# Patient Record
Sex: Female | Born: 1950 | Race: White | Hispanic: No | Marital: Single | Smoking: Current every day smoker
Health system: Southern US, Community
[De-identification: ages and names within clinical notes are randomized; demographics above are authoritative.]

## PROBLEM LIST (undated history)

## (undated) DIAGNOSIS — N200 Calculus of kidney: Secondary | ICD-10-CM

## (undated) DIAGNOSIS — F17200 Nicotine dependence, unspecified, uncomplicated: Secondary | ICD-10-CM

## (undated) DIAGNOSIS — M1610 Unilateral primary osteoarthritis, unspecified hip: Secondary | ICD-10-CM

## (undated) DIAGNOSIS — J309 Allergic rhinitis, unspecified: Secondary | ICD-10-CM

## (undated) DIAGNOSIS — Z860100 Personal history of colon polyps, unspecified: Secondary | ICD-10-CM

## (undated) DIAGNOSIS — M169 Osteoarthritis of hip, unspecified: Secondary | ICD-10-CM

## (undated) DIAGNOSIS — K259 Gastric ulcer, unspecified as acute or chronic, without hemorrhage or perforation: Secondary | ICD-10-CM

## (undated) DIAGNOSIS — F419 Anxiety disorder, unspecified: Secondary | ICD-10-CM

## (undated) DIAGNOSIS — E7800 Pure hypercholesterolemia, unspecified: Secondary | ICD-10-CM

## (undated) DIAGNOSIS — I10 Essential (primary) hypertension: Secondary | ICD-10-CM

## (undated) DIAGNOSIS — D492 Neoplasm of unspecified behavior of bone, soft tissue, and skin: Secondary | ICD-10-CM

## (undated) DIAGNOSIS — M1990 Unspecified osteoarthritis, unspecified site: Secondary | ICD-10-CM

## (undated) DIAGNOSIS — F329 Major depressive disorder, single episode, unspecified: Secondary | ICD-10-CM

## (undated) DIAGNOSIS — F32A Depression, unspecified: Secondary | ICD-10-CM

## (undated) HISTORY — DX: Osteoarthritis of hip, unspecified: M16.9

## (undated) HISTORY — DX: Calculus of kidney: N20.0

## (undated) HISTORY — PX: TUBAL LIGATION: SHX77

## (undated) HISTORY — DX: Gastric ulcer, unspecified as acute or chronic, without hemorrhage or perforation: K25.9

## (undated) HISTORY — DX: Neoplasm of unspecified behavior of bone, soft tissue, and skin: D49.2

## (undated) HISTORY — DX: Anxiety disorder, unspecified: F41.9

## (undated) HISTORY — DX: Personal history of colon polyps, unspecified: Z86.0100

## (undated) HISTORY — DX: Pure hypercholesterolemia, unspecified: E78.00

## (undated) HISTORY — DX: Depression, unspecified: F32.A

## (undated) HISTORY — DX: Unilateral primary osteoarthritis, unspecified hip: M16.10

## (undated) HISTORY — DX: Essential (primary) hypertension: I10

## (undated) HISTORY — DX: Nicotine dependence, unspecified, uncomplicated: F17.200

## (undated) HISTORY — PX: COSMETIC SURGERY: SHX468

## (undated) HISTORY — DX: Allergic rhinitis, unspecified: J30.9

## (undated) HISTORY — DX: Unspecified osteoarthritis, unspecified site: M19.90

## (undated) HISTORY — DX: Personal history of colonic polyps: Z86.010

## (undated) HISTORY — DX: Major depressive disorder, single episode, unspecified: F32.9

---
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
## 1974-07-20 HISTORY — PX: OTHER SURGICAL HISTORY: SHX169

## 1975-07-21 HISTORY — PX: ABDOMINAL HYSTERECTOMY: SHX81

## 2007-10-20 ENCOUNTER — Emergency Department: Payer: Self-pay | Admitting: Emergency Medicine

## 2008-09-17 HISTORY — PX: OTHER SURGICAL HISTORY: SHX169

## 2009-07-20 HISTORY — PX: SQUAMOUS CELL CARCINOMA EXCISION: SHX2433

## 2010-07-28 ENCOUNTER — Ambulatory Visit: Payer: Self-pay | Admitting: Unknown Physician Specialty

## 2010-07-30 LAB — PATHOLOGY REPORT

## 2010-08-20 HISTORY — PX: OTHER SURGICAL HISTORY: SHX169

## 2010-09-08 ENCOUNTER — Emergency Department: Payer: Self-pay | Admitting: Internal Medicine

## 2011-09-28 IMAGING — CT CT STONE STUDY
1 of 2 series · 16 of 32 positions shown, 20 images · non-contrast
Comparison: none

REASON FOR EXAM: flank pain r
COMMENTS:

PROCEDURE:     CT  - CT ABDOMEN /PELVIS WO (STONE)  - September 08, 2010 [DATE]
RESULT:     History: Right flank pain.
Comparison Study: No prior.

[Series 2: soft tissue · axial · 0.78mm/px · z∈[-1084,-640]mm · 16 of 162 slices shown, 20 images]
[im 7/162  soft-tissue]
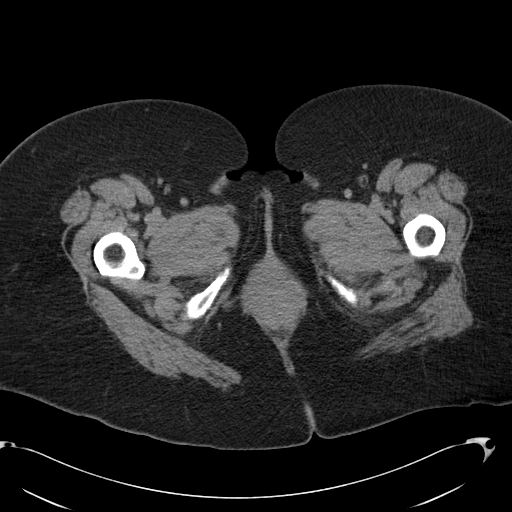
[im 7/162  bone]
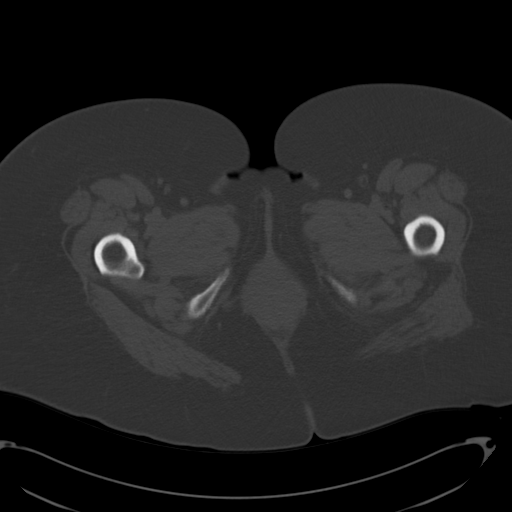
[im 20/162  soft-tissue]
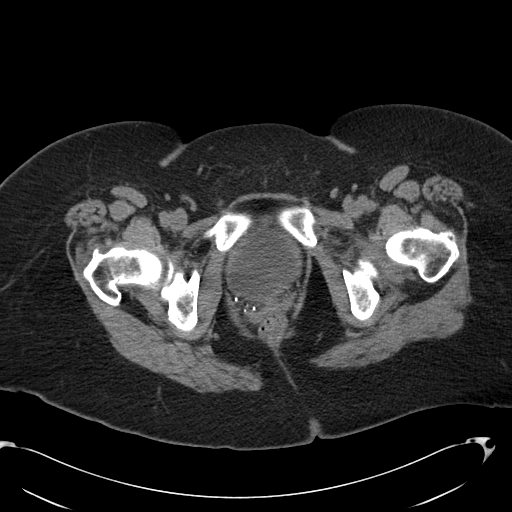
[im 33/162  soft-tissue]
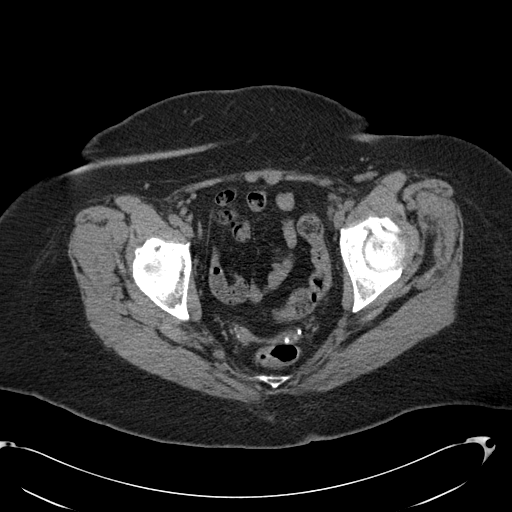
[im 46/162  soft-tissue]
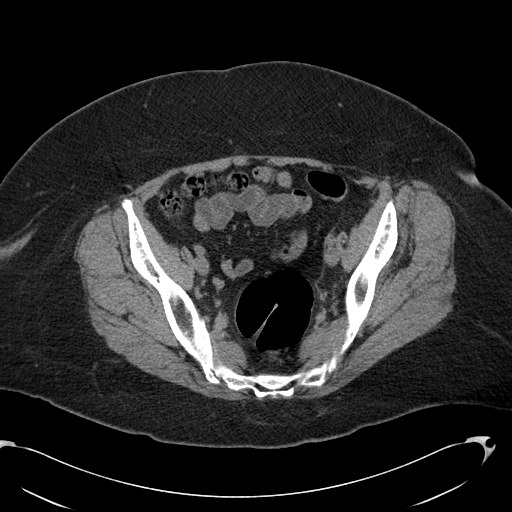
[im 52/162  soft-tissue]
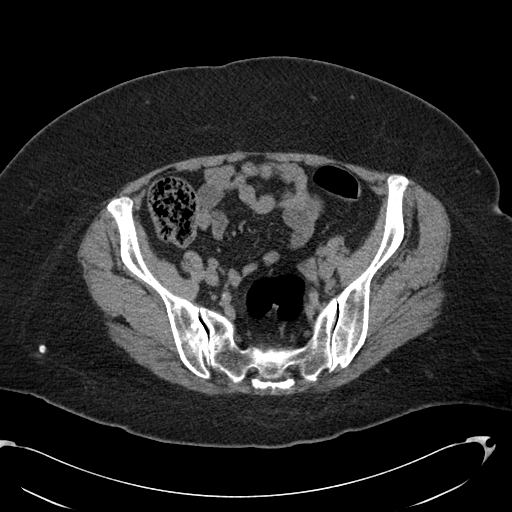
[im 65/162  soft-tissue]
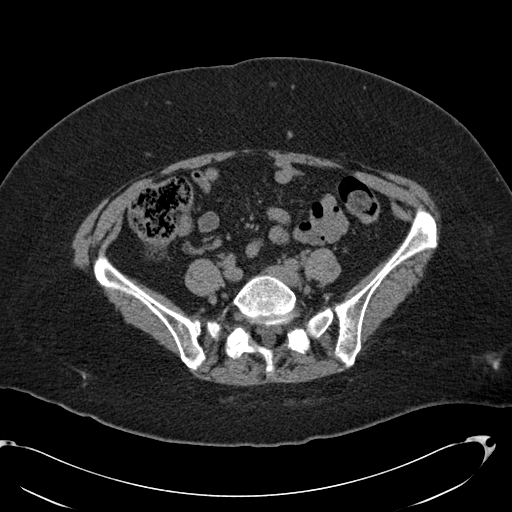
[im 78/162  soft-tissue]
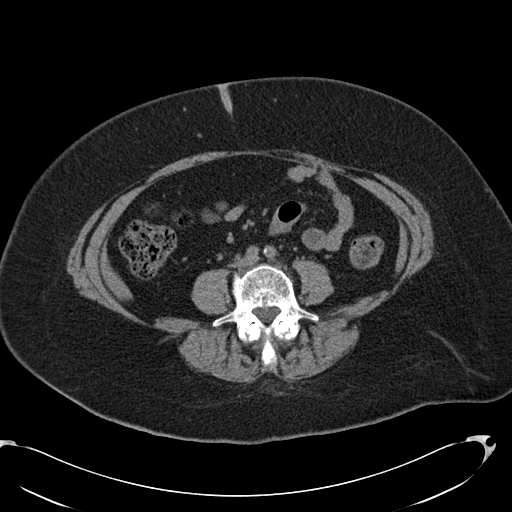
[im 84/162  soft-tissue]
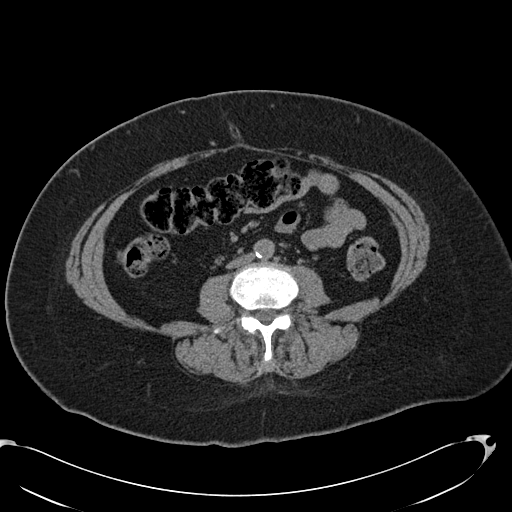
[im 97/162  soft-tissue]
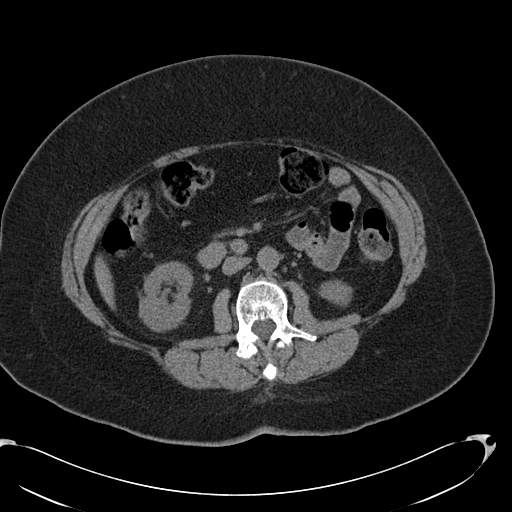
[im 97/162  bone]
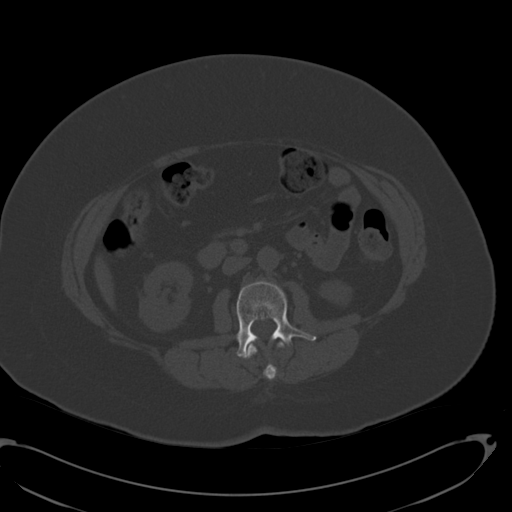
[im 110/162  soft-tissue]
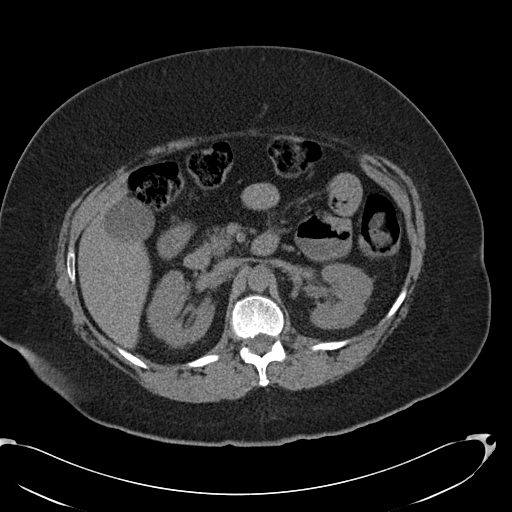
[im 123/162  soft-tissue]
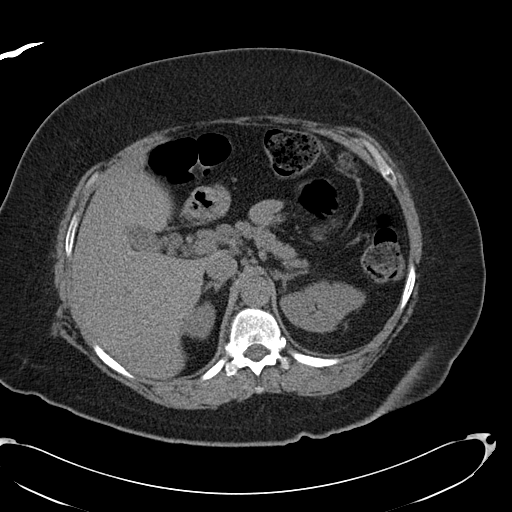
[im 129/162  soft-tissue]
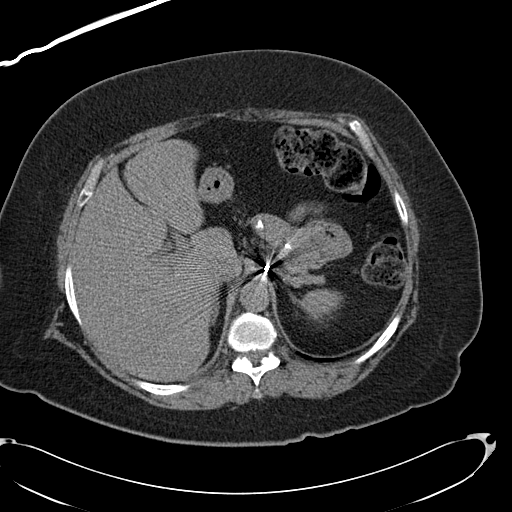
[im 136/162  lung]
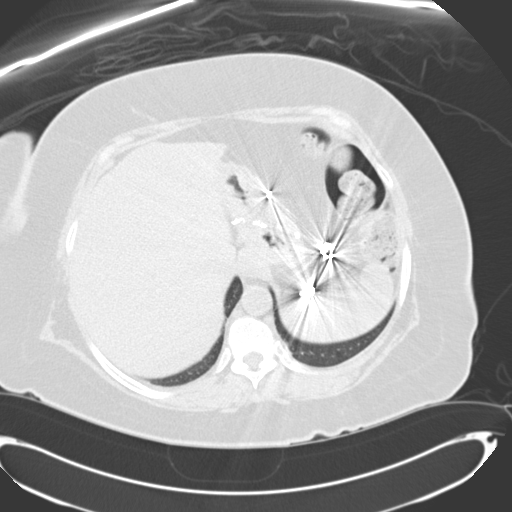
[im 142/162  soft-tissue]
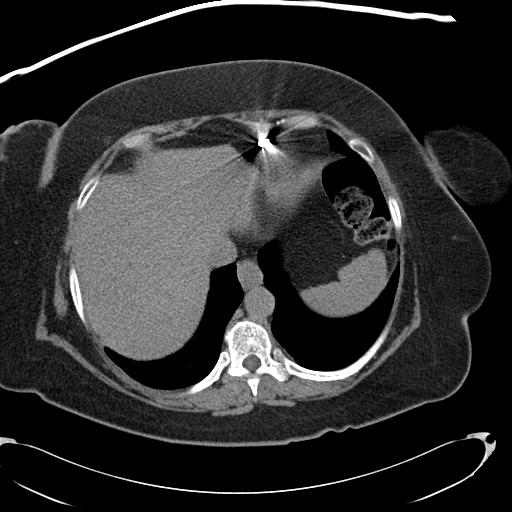
[im 142/162  lung]
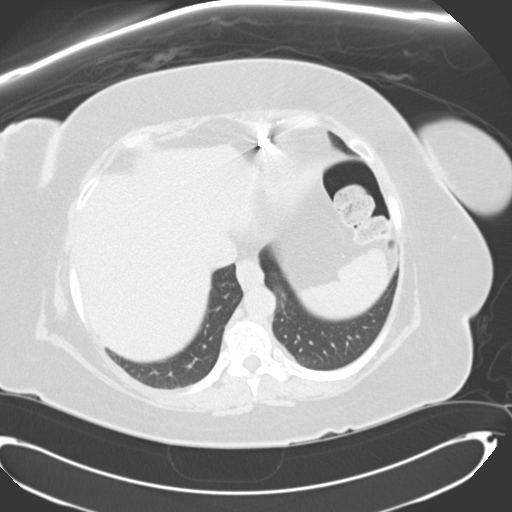
[im 149/162  lung]
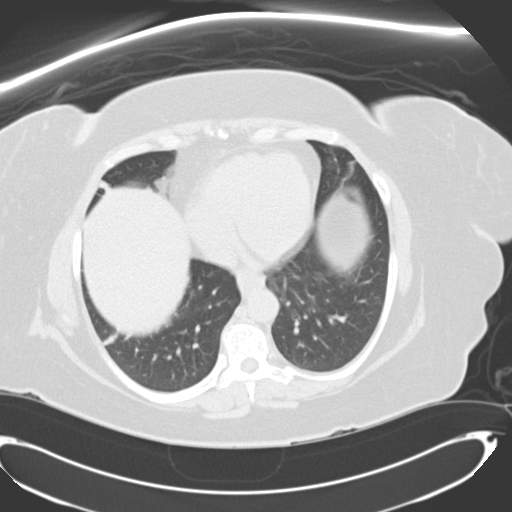
[im 155/162  soft-tissue]
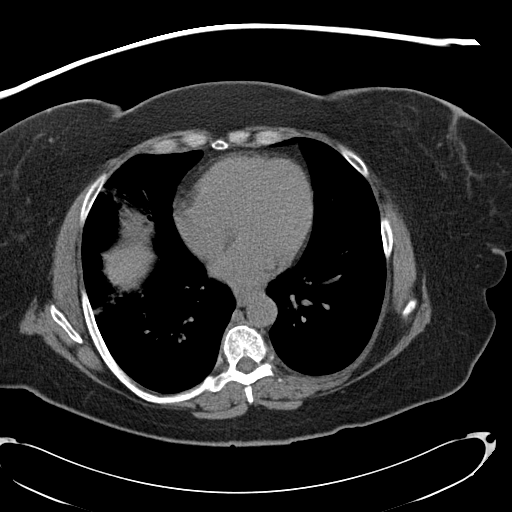
[im 155/162  lung]
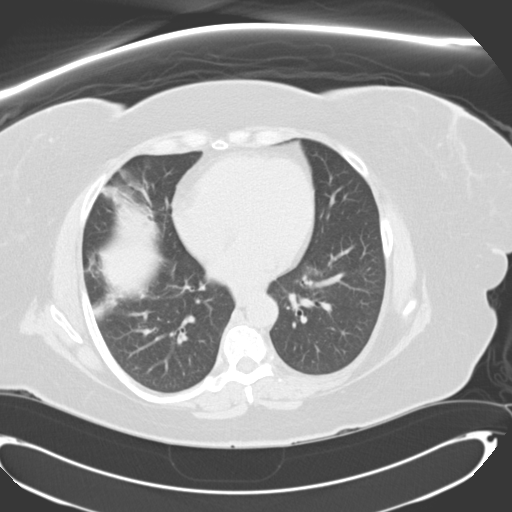

[16 of 32 positions shown; findings below may reference images not displayed]

FINDINGS: Standard nonenhanced CT obtained. Liver normal. Spleen normal.
Calcifications in the pancreas consistent with pancreatitis. Surgical clips
left upper quadrant. Adrenals are normal. Left nephrolithiasis noted. No
evidence of urolithiasis. Bladder is nondistended. Calcifications in the
pelvis consistent with phleboliths. Right lung base pneumonia versus mild
atelectasis. No free air. Appendix normal.
IMPRESSION: 1. Left nephrolithiasis.
2. Calcification of the pancreas consistent chronic pancreatitis. Surgical
clips are also noted left upper quadrant. Streak artifact is present making
this region difficult to evaluate.
4. Right base atelectasis. Mild pneumonia cannot be excluded.

## 2012-04-27 ENCOUNTER — Ambulatory Visit (INDEPENDENT_AMBULATORY_CARE_PROVIDER_SITE_OTHER): Payer: Medicare HMO | Admitting: Family Medicine

## 2012-04-27 ENCOUNTER — Encounter: Payer: Self-pay | Admitting: Family Medicine

## 2012-04-27 VITALS — BP 118/60 | HR 86 | Temp 98.3°F | Resp 16 | Ht 63.5 in | Wt 222.3 lb

## 2012-04-27 DIAGNOSIS — F419 Anxiety disorder, unspecified: Secondary | ICD-10-CM

## 2012-04-27 DIAGNOSIS — F341 Dysthymic disorder: Secondary | ICD-10-CM

## 2012-04-27 DIAGNOSIS — L989 Disorder of the skin and subcutaneous tissue, unspecified: Secondary | ICD-10-CM

## 2012-04-27 DIAGNOSIS — Z23 Encounter for immunization: Secondary | ICD-10-CM

## 2012-04-27 MED ORDER — ALPRAZOLAM 0.5 MG PO TABS: 0.5000 mg | ORAL_TABLET | Freq: Two times a day (BID) | ORAL | Status: DC

## 2012-04-27 NOTE — Progress Notes (Signed)
[address]   [place], [place]  [address]   [phone]  Subjective:    Patient ID: Lauren Pittman, female    DOB: Nov 18, 1950, 61 y.o.   MRN: [id number]  HPIThis 61 y.o. female presents to establish care and for follow-up:  1.  Skin lesion L facial:  S/p evaluation by Dasher who stated that lesion nothing to worry about; area has now changed and enlarged.  Does not want to see Dasher again.   +Indentations of L facial lesion; s/p face lift by [facility] in [place] in 2008.  History of skin cancer.  2.  Deperssion/Anxiety: stable since last visit six months ago; no changes to management made at last visit.  Compliance with Cymbalta 60mg  two daily.  Taking Xanax 1/2-1 daily.  Spent 9 weeks of summer in [place] with best friend; feels so much better physically and thus mentally in [place]; did not want to return home but had to.  Son came out to get pateint from [place].  Taking stepdaughter's Cymbalta for past several months; stepdaughter's copay is much cheaper than patient's copay.  Denies SI/HI.  Mother stable with dementia; behavior has actually improved; now mother is quieter.  Good family support.   3.  Rheumatology:  Had to spread out of medication narcotics due to travel.  Now on much less medication.  Did not return to pain center for three weeks after returning home.  Followed by Pain Specialist every month; switching to clinic with rheuamtology.  [place] twice yearly.  Flares not as severe.  Good regimen at current time.  Does not want to get addicted to narcotics again; trying very hard to use medications sparingly.  Had horrible withdrawals while in [place].    4.  Flu vaccine: agreeable.    Review of Systems  Constitutional: Negative for fever, chills, diaphoresis and fatigue.  Cardiovascular: Negative for chest pain, palpitations and leg swelling.  Musculoskeletal: Positive for myalgias, back pain, joint swelling and arthralgias. Negative for gait problem.  Skin: Positive for color  change and rash.  Psychiatric/Behavioral: Positive for dysphoric mood. Negative for suicidal ideas, confusion, disturbed wake/sleep cycle, self-injury and decreased concentration. The patient is nervous/anxious.        Objective:   Physical Exam  Constitutional: She is oriented to person, place, and time. She appears well-developed and well-nourished. No distress.  HENT:  Head: Normocephalic and atraumatic.  Eyes: Conjunctivae normal are normal. Pupils are equal, round, and reactive to light.  Neck: Normal range of motion. Neck supple. No thyromegaly present.  Cardiovascular: Normal rate, regular rhythm, normal heart sounds and intact distal pulses.   No murmur heard. Pulmonary/Chest: Effort normal and breath sounds normal.  Lymphadenopathy:    She has no cervical adenopathy.  Neurological: She is alert and oriented to person, place, and time. No cranial nerve deficit. She exhibits normal muscle tone. Coordination normal.  Skin: She is not diaphoretic.       L FACIAL REGION: TWO SMALL 3 MM PUSTULAR LESIONS WITH 1CM X SCAR/INDENTATION OF SKIN NORMAL PIGMENTATION.  NO INDURATION, FLUCTUANTS, TENDERNESS.  NO ERYTHEMA OTHER THAN LOCALIZED TO VERY SMALL PUSTULES; NO VESICLES OR SCALING.  Psychiatric: She has a normal mood and affect. Her behavior is normal. Judgment and thought content normal.       Assessment & Plan:   1. Anxiety and depression  ALPRAZolam (XANAX) 0.5 MG tablet  2. Need for prophylactic vaccination and inoculation against influenza  Flu vaccine greater than or equal to 3yo preservative  free IM  3. Skin lesion of face  Ambulatory referral to Dermatology     1.  Anxiety with depression: Controlled; no change in medications; refills provided of Xanax.  Follow-up six months. 2.  Skin lesion L facial region: Worsening/enlarging.  S/p dermatology consultation by Dasher who provided reassurance; now lesion enlarging and changing; refer to dermatology. 3.  S/p influenza  vaccine.

## 2012-04-27 NOTE — Patient Instructions (Addendum)
1. Anxiety and depression  ALPRAZolam (XANAX) 0.5 MG tablet  2. Need for prophylactic vaccination and inoculation against influenza  Flu vaccine greater than or equal to 61yo preservative free IM  3. Skin lesion of face  Ambulatory referral to Dermatology

## 2012-04-28 ENCOUNTER — Encounter: Payer: Self-pay | Admitting: Family Medicine

## 2012-04-29 NOTE — Progress Notes (Signed)
Reviewed and agree.

## 2012-09-27 ENCOUNTER — Encounter: Payer: Self-pay | Admitting: *Deleted

## 2012-10-03 ENCOUNTER — Encounter: Payer: Medicare HMO | Admitting: Family Medicine

## 2012-11-07 ENCOUNTER — Encounter: Payer: Self-pay | Admitting: Family Medicine

## 2012-11-07 ENCOUNTER — Ambulatory Visit (INDEPENDENT_AMBULATORY_CARE_PROVIDER_SITE_OTHER): Payer: Medicare HMO | Admitting: Family Medicine

## 2012-11-07 ENCOUNTER — Ambulatory Visit: Payer: Medicare HMO

## 2012-11-07 VITALS — BP 155/93 | HR 97 | Temp 97.7°F | Resp 20 | Ht 62.5 in | Wt 232.8 lb

## 2012-11-07 DIAGNOSIS — F341 Dysthymic disorder: Secondary | ICD-10-CM

## 2012-11-07 DIAGNOSIS — I10 Essential (primary) hypertension: Secondary | ICD-10-CM

## 2012-11-07 DIAGNOSIS — Z0000 Encounter for general adult medical examination without abnormal findings: Secondary | ICD-10-CM

## 2012-11-07 DIAGNOSIS — J209 Acute bronchitis, unspecified: Secondary | ICD-10-CM

## 2012-11-07 DIAGNOSIS — R51 Headache: Secondary | ICD-10-CM

## 2012-11-07 DIAGNOSIS — F418 Other specified anxiety disorders: Secondary | ICD-10-CM

## 2012-11-07 LAB — COMPREHENSIVE METABOLIC PANEL
AST: 18 U/L (ref 0–37)
Albumin: 4.3 g/dL (ref 3.5–5.2)
Alkaline Phosphatase: 99 U/L (ref 39–117)
Potassium: 4.5 mEq/L (ref 3.5–5.3)
Sodium: 139 mEq/L (ref 135–145)
Total Bilirubin: 0.6 mg/dL (ref 0.3–1.2)
Total Protein: 7.5 g/dL (ref 6.0–8.3)

## 2012-11-07 LAB — CBC WITH DIFFERENTIAL/PLATELET
Basophils Absolute: 0 10*3/uL (ref 0.0–0.1)
Basophils Relative: 0 % (ref 0–1)
Eosinophils Absolute: 0.1 10*3/uL (ref 0.0–0.7)
Hemoglobin: 14.4 g/dL (ref 12.0–15.0)
MCHC: 34 g/dL (ref 30.0–36.0)
Neutro Abs: 5.8 10*3/uL (ref 1.7–7.7)
Neutrophils Relative %: 61 % (ref 43–77)
Platelets: 299 10*3/uL (ref 150–400)
RDW: 13.8 % (ref 11.5–15.5)

## 2012-11-07 LAB — LIPID PANEL
HDL: 61 mg/dL (ref >39)
LDL Cholesterol: 131 mg/dL — ABNORMAL HIGH (ref 0–99)
Total CHOL/HDL Ratio: 3.7 Ratio
Triglycerides: 157 mg/dL — ABNORMAL HIGH (ref <150)
VLDL: 31 mg/dL (ref 0–40)

## 2012-11-07 LAB — POCT URINALYSIS DIPSTICK
Blood, UA: NEGATIVE
Glucose, UA: NEGATIVE
Ketones, UA: NEGATIVE
Protein, UA: NEGATIVE
Spec Grav, UA: 1.015
Urobilinogen, UA: 0.2

## 2012-11-07 LAB — HEMOGLOBIN A1C
Hgb A1c MFr Bld: 5.2 % (ref <5.7)
Mean Plasma Glucose: 103 mg/dL (ref <117)

## 2012-11-07 MED ORDER — CLARITHROMYCIN 500 MG PO TABS: 500.0000 mg | ORAL_TABLET | Freq: Two times a day (BID) | ORAL | Status: DC

## 2012-11-07 MED ORDER — BENZONATATE 100 MG PO CAPS: 100.0000 mg | ORAL_CAPSULE | Freq: Three times a day (TID) | ORAL | Status: DC | PRN

## 2012-11-07 MED ORDER — HYDROCHLOROTHIAZIDE 12.5 MG PO CAPS: 12.5000 mg | ORAL_CAPSULE | Freq: Every day | ORAL | Status: DC

## 2012-11-07 NOTE — Progress Notes (Signed)
[address]   [place], [place]  [address]   [phone]  Subjective:    Patient ID: Lauren Pittman, female    DOB: Feb 16, 1951, 62 y.o.   MRN: [id number]  HPI This 62 y.o. female presents for evaluation of the following:  1.  Elevated blood pressure:  Once stopped Fentanyl patch in 4-5 months ago, blood pressure has been elevated.  160/102.  Really worried about blood pressure.  Plans to start swimming.  2.  Cough: worried about pneumonia.  Onset one week ago.  If takes deep breath, having L lower lung pain.  No fever/+chills and sweats.  +sputum brownish green.  +SOB; +wheezing.  +rhinorrhea; +nasal congestion.  +ST to begin with; severely sore and then resolved; no ear pain.  No v/d.  Alkeseltzer Cold; nothing in two days.  3.  Rheumatoid Arthritis:   Son with girlfriend with Hepatitis B.  Son has been to health department; needs to get immunized.  Son really loves her.  Girlfriend will be moving in with son, patient.  S/p Hepatitis B series in past.    4.  Severe HA:  If has orgasm, has severe headache; duration x 30 minutes.  Parietal headache; duration x 1 month.  Must be very still, control breathing, talk self through.  +blurred vision.  +dizziness.  +nausea.  No sensitivity to lights or sounds.  Five episodes atleast in past month.     Review of Systems  Constitutional: Positive for fever, chills, diaphoresis and fatigue.  HENT: Positive for congestion, sore throat, rhinorrhea and voice change. Negative for ear pain, sneezing, postnasal drip and sinus pressure.   Eyes: Positive for visual disturbance. Negative for photophobia.  Respiratory: Positive for cough, shortness of breath and wheezing.   Cardiovascular: Negative for chest pain, palpitations and leg swelling.  Gastrointestinal: Negative for nausea and vomiting.  Musculoskeletal: Positive for arthralgias.  Neurological: Positive for dizziness, light-headedness and headaches. Negative for tremors, syncope, facial asymmetry,  speech difficulty, weakness and numbness.  Psychiatric/Behavioral: Positive for dysphoric mood. Negative for sleep disturbance. The patient is nervous/anxious.     Past Medical History  Diagnosis Date  . Anxiety   . Arthritis   . Depression   . Gastric ulcer   . Chest pain, unspecified   . Calculus of kidney   . Pure hypercholesterolemia   . Allergic rhinitis, cause unspecified   . Tobacco use disorder   . Personal history of colonic polyps   . Osteoarthrosis, unspecified whether generalized or localized, pelvic region and thigh   . Neoplasm of unspecified nature of bone, soft tissue, and skin     Past Surgical History  Procedure Laterality Date  . Appendectomy  1969  . Gastric ulcer  1976  . Abdominal hysterectomy  1977  . Kidney stones  08/2010  . 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 Quick lift and botox  09/2008    Adriana Simas in [place]    Prior to Admission medications   Medication Sig Start Date End Date Taking? Authorizing Provider  ALPRAZolam Prudy Feeler) 0.5 MG tablet Take 1 tablet (0.5 mg total) by mouth 2 (two) times daily. 04/27/12  Yes Ethelda Chick, MD  azaTHIOprine (IMURAN) 50 MG tablet Take 100 mg by mouth daily.   Yes Historical Provider, MD  Cyanocobalamin (VITAMIN B-12 PO) Take by mouth daily.   Yes Historical Provider, MD  NON FORMULARY Steroid injections.   Yes Historical Provider, MD  oxyCODONE (OXYCONTIN) 10 MG 12  hr tablet Take 10 mg by mouth every 12 (twelve) hours.   Yes Historical Provider, MD  piroxicam (FELDENE) 20 MG capsule Take 20 mg by mouth daily.   Yes Historical Provider, MD  Pyridoxine HCl (VITAMIN B-6 PO) Take by mouth daily.   Yes Historical Provider, MD  benzonatate (TESSALON) 100 MG capsule Take 1-2 capsules (100-200 mg total) by mouth 3 (three) times daily as needed for cough. 11/07/12   Ethelda Chick, MD  clarithromycin (BIAXIN) 500 MG tablet Take 1 tablet (500 mg total) by mouth 2 (two) times daily. 11/07/12   Ethelda Chick, MD  DULoxetine (CYMBALTA) 60 MG capsule TAKE 2 CAPSULES BY MOUTH EVERY DAY 11/14/12   Ethelda Chick, MD  hydrochlorothiazide (MICROZIDE) 12.5 MG capsule Take 1 capsule (12.5 mg total) by mouth daily. 11/07/12   Ethelda Chick, MD    Allergies  Allergen Reactions  . Contrast Media (Iodinated Diagnostic Agents) Itching    Near syncope   . Naproxen Itching    History   Social History  . Marital Status: Single    Spouse Name: N/A    Number of Children: 1  . Years of Education: COLLEGE   Occupational History  . RETIRED   . DISABLED    Social History Main Topics  . Smoking status: Current Every Day Smoker -- 40 years    Types: Cigarettes  . Smokeless tobacco: Not on file     Comment: LESS THAN 1 PACK/DAY  . Alcohol Use: No  . Drug Use: No  . Sexually Active: Not on file   Other Topics Concern  . Not on file   Social History Narrative   Lives: with mother in [place]; mother has dementia.       Tobacco: 1 ppd     Alcohol: none     Drugs: none   Caffeine use: moderate amount   Marital Status: Divorced; since 2005 after 18 years or marriage. Not dating, moved to [facility] in 02/2009 and moved back to [place] in 11/2010. Ex-husband leans on patient after the death of his wife. Happy with current situation.   Exercise: Light; pt attempts to go to the [facility] some.    Family History  Problem Relation Age of Onset  . Dementia Mother   . Depression Mother   . Cancer - Other      GI tract  . Cancer Father     colon and kidney  . Cancer Maternal Grandmother   . Cancer Maternal Grandfather   . Cancer Paternal Grandmother   . Cancer Paternal Grandfather        Objective:   Physical Exam  Nursing note and vitals reviewed. Constitutional: She is oriented to person, place, and time. She appears well-developed and well-nourished. No distress.  HENT:  Head: Normocephalic and atraumatic.  Right Ear: External ear normal.  Left Ear: External ear normal.  Nose: Nose  normal.  Mouth/Throat: Oropharynx is clear and moist.  Eyes: Conjunctivae and EOM are normal. Pupils are equal, round, and reactive to light.  Neck: Normal range of motion. Neck supple. No JVD present. No thyromegaly present.  Cardiovascular: Normal rate, regular rhythm, normal heart sounds and intact distal pulses.  Exam reveals no gallop and no friction rub.   No murmur heard. Pulmonary/Chest: Effort normal and breath sounds normal. No respiratory distress. She has no wheezes. She has no rales.  Abdominal: Soft. Bowel sounds are normal. She exhibits no distension and no mass. There is no tenderness.  There is no rebound and no guarding.  Lymphadenopathy:    She has no cervical adenopathy.  Neurological: She is alert and oriented to person, place, and time.  Skin: Skin is warm and dry. No rash noted. She is not diaphoretic.  Psychiatric: She has a normal mood and affect. Her behavior is normal. Judgment and thought content normal.   EKG: NSR; no acute changes.    [facility] reading (PRIMARY) by  Dr. Katrinka Blazing.  CXR:NAD   Assessment & Plan:   Headache - Plan: MR MRA Head/Brain Wo Cm  Routine general medical examination at a health care facility  Acute bronchitis - Plan: DG Chest 2 View, clarithromycin (BIAXIN) 500 MG tablet, benzonatate (TESSALON) 100 MG capsule  Depression with anxiety  Essential hypertension, benign - Plan: Hemoglobin A1c, Lipid panel, CBC with Differential, Comprehensive metabolic panel, POCT urinalysis dipstick, EKG 12-Lead, hydrochlorothiazide (MICROZIDE) 12.5 MG capsule   1. Headache:  New onset; exertional in nature; concerning for aneurysm; refer for MRA/MRI brain.  Advised to avoid exertion/orgasm until MRI/MRA.  To ED for persistent, severe HA.  Normal neurological exam in office. 2.  Acute bronchitis: New. Rx for Biaxin, Tessalon Perles provided. RTC for acute worsening. 3.  Depression with anxiety: stable with Cymbalta; no changes in management at this time. 4.   HTN:  New onset.  Rx for HCTZ provided; recommend checking BP weekly at home.  Meds ordered this encounter  Medications  . azaTHIOprine (IMURAN) 50 MG tablet    Sig: Take 100 mg by mouth daily.  Marland Kitchen oxyCODONE (OXYCONTIN) 10 MG 12 hr tablet    Sig: Take 10 mg by mouth every 12 (twelve) hours.  . clarithromycin (BIAXIN) 500 MG tablet    Sig: Take 1 tablet (500 mg total) by mouth 2 (two) times daily.    Dispense:  28 tablet    Refill:  0  . benzonatate (TESSALON) 100 MG capsule    Sig: Take 1-2 capsules (100-200 mg total) by mouth 3 (three) times daily as needed for cough.    Dispense:  40 capsule    Refill:  0  . hydrochlorothiazide (MICROZIDE) 12.5 MG capsule    Sig: Take 1 capsule (12.5 mg total) by mouth daily.    Dispense:  30 capsule    Refill:  5

## 2012-11-11 ENCOUNTER — Telehealth: Payer: Self-pay | Admitting: *Deleted

## 2012-11-11 ENCOUNTER — Encounter: Payer: Self-pay | Admitting: *Deleted

## 2012-11-11 NOTE — Telephone Encounter (Signed)
Pt recently went to the ER in her hometown- Dr Sabino Gasser, [facility] due to a headache. She states while she was there she had an MRI without contrast. She is asking if this would be sufficient for the test ordered. She is wondering if she should have a referral to neurology. All referrals have to be in [place].

## 2012-11-11 NOTE — Telephone Encounter (Signed)
Medical records was not involved in this yesterday. However, we will mail a ROI to the patient.

## 2012-11-11 NOTE — Telephone Encounter (Signed)
Yes, patient would need to sign release. Thanks, would have been helpful it this had been done yesterday when she was here, we may need to mail the request to patient, do I need to call, or can you do this?

## 2012-11-11 NOTE — Telephone Encounter (Signed)
MRI without contrast is an excellent study. I also wanted her to have MRA brain which evaluates blood vessels in brain; if she did not have MRA brain at ED visit, then I can order just MRA brain.  However, I need her records from ED to review to confirm exactly what study was ordered.  Please call [facility] ED for records.

## 2012-11-11 NOTE — Telephone Encounter (Signed)
Spoke to [place] in Medical [facility] at [facility]. She says that if this medical request is for treatment and if the patient is still here, then they will fax it. Misty Stanley says they require a medical request if Dr. Katrinka Blazing needed just ED records to place an order in the future. So a release needs to be filled by the patient since the patient is no longer here during office visit correct?   [facility] Telephone: [phone] [facility] fax number is: [phone]

## 2012-11-14 ENCOUNTER — Other Ambulatory Visit: Payer: Self-pay | Admitting: Family Medicine

## 2013-01-18 ENCOUNTER — Ambulatory Visit (INDEPENDENT_AMBULATORY_CARE_PROVIDER_SITE_OTHER): Payer: Medicare HMO | Admitting: Family Medicine

## 2013-01-18 VITALS — BP 150/102 | HR 94 | Temp 97.9°F | Resp 18 | Ht 63.5 in | Wt 242.0 lb

## 2013-01-18 DIAGNOSIS — F329 Major depressive disorder, single episode, unspecified: Secondary | ICD-10-CM

## 2013-01-18 DIAGNOSIS — R41840 Attention and concentration deficit: Secondary | ICD-10-CM

## 2013-01-18 DIAGNOSIS — F341 Dysthymic disorder: Secondary | ICD-10-CM

## 2013-01-18 DIAGNOSIS — R51 Headache: Secondary | ICD-10-CM

## 2013-01-18 DIAGNOSIS — I10 Essential (primary) hypertension: Secondary | ICD-10-CM

## 2013-01-18 DIAGNOSIS — F32A Depression, unspecified: Secondary | ICD-10-CM

## 2013-01-18 LAB — POCT CBC
Granulocyte percent: 79.7 %G (ref 37–80)
HCT, POC: 47.2 % (ref 37.7–47.9)
Hemoglobin: 14.8 g/dL (ref 12.2–16.2)
Lymph, poc: 1.7 (ref 0.6–3.4)
MCHC: 31.4 g/dL — AB (ref 31.8–35.4)
MPV: 9.3 fL (ref 0–99.8)
POC Granulocyte: 8.3 — AB (ref 2–6.9)
POC MID %: 4.2 %M (ref 0–12)
RBC: 4.72 M/uL (ref 4.04–5.48)

## 2013-01-18 MED ORDER — TRIAMTERENE-HCTZ 37.5-25 MG PO TABS: 1.0000 | ORAL_TABLET | Freq: Every day | ORAL | Status: DC

## 2013-01-18 MED ORDER — METHYLPHENIDATE HCL ER (OSM) 18 MG PO TBCR: 18.0000 mg | EXTENDED_RELEASE_TABLET | ORAL | Status: DC

## 2013-01-18 NOTE — Progress Notes (Signed)
[address]   [place], [place]  [address]   [phone]  Subjective:    Patient ID: Lauren Pittman, female    DOB: 1950/11/16, 62 y.o.   MRN: [id number]  HPI This 62 y.o. female presents for three month follow-up:  1.  Anxiety and depression: mother has been hospitalized x 3; mother cannot walk anymore; hollaring all day long; last ED visit, ED physician recommended placement.  Mother's income $3000; expense for NH is $6500; talked with DSS; likely will qualify for Medicaid.  Will only be skilled care.  DSS coordinator has given a list of things to complete.  Getting distracted; avoiding completing paperwork.  Unable to concentrate; with starting Cymbalta, concentration improved.  Mother has been placed.  Has 45 days to complete paperwork.  No guilt about placement but cannot force self to complete paperwork.  Needs to collect large amount of financial data regarding mother; feels very overwhelmed.  Lack of motivation.  2. Headaches Coital: presented to ED in [place] for severe headache; s/p MRI that was negative.  3. HTN: blood pressure remaining elevated but under tremendous amount of stress.    Review of Systems  Constitutional: Negative for fever, chills, diaphoresis and fatigue.  Eyes: Negative for photophobia and visual disturbance.  Respiratory: Negative for shortness of breath, wheezing and stridor.   Cardiovascular: Negative for chest pain, palpitations and leg swelling.  Gastrointestinal: Negative for nausea and vomiting.  Neurological: Positive for headaches. Negative for dizziness, tremors, seizures, syncope, facial asymmetry, speech difficulty, weakness, light-headedness and numbness.  Psychiatric/Behavioral: Positive for dysphoric mood and decreased concentration. Negative for suicidal ideas, sleep disturbance and self-injury. The patient is nervous/anxious.    Past Medical History  Diagnosis Date  . Anxiety   . Arthritis   . Depression   . Gastric ulcer   . Chest  pain, unspecified   . Calculus of kidney   . Pure hypercholesterolemia   . Allergic rhinitis, cause unspecified   . Tobacco use disorder   . Personal history of colonic polyps   . Osteoarthrosis, unspecified whether generalized or localized, pelvic region and thigh   . Neoplasm of unspecified nature of bone, soft tissue, and skin    History   Social History  . Marital Status: Single    Spouse Name: N/A    Number of Children: 1  . Years of Education: COLLEGE   Occupational History  . RETIRED   . DISABLED    Social History Main Topics  . Smoking status: Current Every Day Smoker -- 40 years    Types: Cigarettes  . Smokeless tobacco: Not on file     Comment: LESS THAN 1 PACK/DAY  . Alcohol Use: No  . Drug Use: No  . Sexually Active: Not on file   Other Topics Concern  . Not on file   Social History Narrative   Lives: with mother in [place]; mother has dementia.       Tobacco: 1 ppd     Alcohol: none     Drugs: none   Caffeine use: moderate amount   Marital Status: Divorced; since 2005 after 18 years or marriage. Not dating, moved to [facility] in 02/2009 and moved back to [place] in 11/2010. Ex-husband leans on patient after the death of his wife. Happy with current situation.   Exercise: Light; pt attempts to go to the [facility] some.   Family History  Problem Relation Age of Onset  . Dementia Mother   . Depression Mother   .  Cancer - Other      GI tract  . Cancer Father     colon and kidney  . Cancer Maternal Grandmother   . Cancer Maternal Grandfather   . Cancer Paternal Grandmother   . Cancer Paternal Grandfather    Current Outpatient Prescriptions on File Prior to Visit  Medication Sig Dispense Refill  . azaTHIOprine (IMURAN) 50 MG tablet Take 100 mg by mouth daily.      . Cyanocobalamin (VITAMIN B-12 PO) Take by mouth daily.      . DULoxetine (CYMBALTA) 60 MG capsule TAKE 2 CAPSULES BY MOUTH EVERY DAY  60 capsule  5  . hydrochlorothiazide (MICROZIDE) 12.5  MG capsule Take 1 capsule (12.5 mg total) by mouth daily.  30 capsule  5  . NON FORMULARY Steroid injections.      Marland Kitchen oxyCODONE (OXYCONTIN) 10 MG 12 hr tablet Take 10 mg by mouth every 12 (twelve) hours.      . piroxicam (FELDENE) 20 MG capsule Take 20 mg by mouth daily.      . Pyridoxine HCl (VITAMIN B-6 PO) Take by mouth daily.       No current facility-administered medications on file prior to visit.       Objective:   Physical Exam  Nursing note and vitals reviewed. Constitutional: She is oriented to person, place, and time. She appears well-developed and well-nourished. No distress.  HENT:  Head: Normocephalic and atraumatic.  Eyes: Conjunctivae and EOM are normal. Pupils are equal, round, and reactive to light.  Neck: Normal range of motion. Neck supple. No JVD present. No thyromegaly present.  Cardiovascular: Normal rate, regular rhythm, normal heart sounds and intact distal pulses.  Exam reveals no gallop and no friction rub.   No murmur heard. Pulmonary/Chest: Effort normal and breath sounds normal. She has no wheezes. She has no rales.  Lymphadenopathy:    She has no cervical adenopathy.  Neurological: She is alert and oriented to person, place, and time. No cranial nerve deficit. She exhibits normal muscle tone. Coordination normal.  Skin: She is not diaphoretic.  Psychiatric: She has a normal mood and affect. Her behavior is normal. Judgment and thought content normal.       Assessment & Plan:  Disturbed concentration - Plan: methylphenidate (CONCERTA) 18 MG CR tablet  Essential hypertension, benign - Plan: POCT CBC, Comprehensive metabolic panel  Headache(784.0)  Anxiety and depression   1. Disturbed Concentration:  New.   Associated with acute stressors; agreeable to rx for Concerta to assist with completing paperwork.  Discussed side effects of medication including elevation of BP; to monitor BP daily.  One rx should help for next 45 days needed to complete  paperwork. 2.  HTN: uncontrolled; obtain labs.  Change HCTZ to Maxzide.   3.  Headache: stable; s/p MRI brain in [facility] ED; obtain records; no worsening. 4. Anxiety and depression: worsening due to family stressors; counseling provided; continue Cymbalta and Xanax; will supplement with Concerta for upcoming six weeks.  Meds ordered this encounter  Medications  . triamterene-hydrochlorothiazide (MAXZIDE-25) 37.5-25 MG per tablet    Sig: Take 1 tablet by mouth daily.    Dispense:  30 tablet    Refill:  5  . methylphenidate (CONCERTA) 18 MG CR tablet    Sig: Take 1-2 tablets (18-36 mg total) by mouth every morning.    Dispense:  45 tablet    Refill:  0

## 2013-01-19 LAB — COMPREHENSIVE METABOLIC PANEL
ALT: 19 U/L (ref 0–35)
Albumin: 4.5 g/dL (ref 3.5–5.2)
CO2: 24 mEq/L (ref 19–32)
Chloride: 105 mEq/L (ref 96–112)
Glucose, Bld: 128 mg/dL — ABNORMAL HIGH (ref 70–99)
Potassium: 4.6 mEq/L (ref 3.5–5.3)
Sodium: 137 mEq/L (ref 135–145)
Total Bilirubin: 0.4 mg/dL (ref 0.3–1.2)
Total Protein: 7.5 g/dL (ref 6.0–8.3)

## 2013-01-21 ENCOUNTER — Other Ambulatory Visit: Payer: Self-pay | Admitting: Family Medicine

## 2013-01-27 ENCOUNTER — Other Ambulatory Visit: Payer: Self-pay | Admitting: Family Medicine

## 2013-01-30 NOTE — Telephone Encounter (Signed)
Called in.

## 2013-02-01 ENCOUNTER — Ambulatory Visit: Payer: Medicare HMO | Admitting: Family Medicine

## 2013-02-06 ENCOUNTER — Encounter: Payer: Medicare HMO | Admitting: Family Medicine

## 2013-02-15 ENCOUNTER — Encounter: Payer: Medicare HMO | Admitting: Family Medicine

## 2013-03-01 ENCOUNTER — Encounter: Payer: Self-pay | Admitting: Family Medicine

## 2013-03-01 ENCOUNTER — Ambulatory Visit (INDEPENDENT_AMBULATORY_CARE_PROVIDER_SITE_OTHER): Payer: Medicare HMO | Admitting: Family Medicine

## 2013-03-01 VITALS — BP 144/98 | HR 80 | Temp 98.4°F | Resp 18 | Ht 63.5 in | Wt 242.0 lb

## 2013-03-01 DIAGNOSIS — F419 Anxiety disorder, unspecified: Secondary | ICD-10-CM

## 2013-03-01 DIAGNOSIS — Z0000 Encounter for general adult medical examination without abnormal findings: Secondary | ICD-10-CM

## 2013-03-01 DIAGNOSIS — R51 Headache: Secondary | ICD-10-CM

## 2013-03-01 DIAGNOSIS — Z1239 Encounter for other screening for malignant neoplasm of breast: Secondary | ICD-10-CM

## 2013-03-01 DIAGNOSIS — F341 Dysthymic disorder: Secondary | ICD-10-CM

## 2013-03-01 DIAGNOSIS — I10 Essential (primary) hypertension: Secondary | ICD-10-CM

## 2013-03-01 LAB — POCT URINALYSIS DIPSTICK
Bilirubin, UA: NEGATIVE
Ketones, UA: NEGATIVE
Leukocytes, UA: NEGATIVE
Nitrite, UA: NEGATIVE
Protein, UA: NEGATIVE
pH, UA: 5.5

## 2013-03-01 LAB — BASIC METABOLIC PANEL
BUN: 29 mg/dL — ABNORMAL HIGH (ref 6–23)
Calcium: 9.7 mg/dL (ref 8.4–10.5)
Glucose, Bld: 116 mg/dL — ABNORMAL HIGH (ref 70–99)
Potassium: 4.7 mEq/L (ref 3.5–5.3)
Sodium: 138 mEq/L (ref 135–145)

## 2013-03-01 MED ORDER — AMLODIPINE BESYLATE 2.5 MG PO TABS: 2.5000 mg | ORAL_TABLET | Freq: Every day | ORAL | Status: DC

## 2013-03-01 NOTE — Progress Notes (Signed)
[address]   [place], [place]  [address]   [phone]  Subjective:    Patient ID: Lauren Pittman, female    DOB: 09-15-1950, 62 y.o.   MRN: [id number]  HPI This 62 y.o. female presents for Annual Wellness Exam.   Last physical 2013. Pap smear 1/1//2012. Mammogram 09/28/11.  Would like at [facility] in [place]. Colonoscopy 08/07/10. Repeat in 3 years.  [place]. Bone density scan 2012.  At [facility] office. Tetanus 1997. Pneumovax 2008. Zostavax never.  Would like one.   Flu vaccine 04/27/12. Eye exam due; +glasses. Dental exam 2011.  Stress reaction: coping well; mother was placed in NH; patient now trying to cope with change in life responsibilities; son moved to [place]; feels alone; has not been alone in five years.  Not dating.    Concentration issues:  Improved with Concerta but medication made her very sleepy; completed all necessary paperwork for NH placement for mother.  HTN:  At last visit, blood pressure medication changed to Maxzide.  Running 140s/90s on current medication; still very concerned about elevated BP readings.  HA: continues to suffer with horrible post-coital headaches; s/p MRI brain in [place] that was negative; unable to keep neurology appointment in [place]; requesting referral to neurology in [place].   Review of Systems  Constitutional: Positive for fatigue. Negative for fever, chills, diaphoresis, activity change, appetite change and unexpected weight change.  HENT: Negative for hearing loss, ear pain, nosebleeds, congestion, facial swelling, rhinorrhea, sneezing, neck pain, neck stiffness, postnasal drip, tinnitus and ear discharge.   Eyes: Negative for photophobia, pain, discharge, redness, itching and visual disturbance.  Respiratory: Negative for apnea, cough, choking, chest tightness, shortness of breath, wheezing and stridor.   Cardiovascular: Negative for chest pain, palpitations and leg swelling.  Gastrointestinal: Negative  for nausea, vomiting, abdominal pain, diarrhea, constipation, blood in stool, abdominal distention, anal bleeding and rectal pain.  Endocrine: Negative for cold intolerance, heat intolerance, polydipsia, polyphagia and polyuria.  Genitourinary: Negative for dysuria, frequency, hematuria, vaginal discharge, enuresis, genital sores, vaginal pain and pelvic pain.  Musculoskeletal: Positive for myalgias, back pain, joint swelling, arthralgias and gait problem.  Skin: Negative for color change, pallor, rash and wound.  Neurological: Positive for headaches. Negative for dizziness, tremors, seizures, syncope, facial asymmetry, speech difficulty, weakness, light-headedness and numbness.  Hematological: Negative for adenopathy. Does not bruise/bleed easily.  Psychiatric/Behavioral: Positive for dysphoric mood and decreased concentration. Negative for suicidal ideas, hallucinations, behavioral problems, confusion, sleep disturbance, self-injury and agitation. The patient is nervous/anxious. The patient is not hyperactive.        Objective:   Physical Exam  Nursing note and vitals reviewed. Constitutional: She is oriented to person, place, and time. She appears well-developed and well-nourished. No distress.  HENT:  Head: Normocephalic and atraumatic.  Right Ear: External ear normal.  Left Ear: External ear normal.  Nose: Nose normal.  Mouth/Throat: Oropharynx is clear and moist.  Eyes: Conjunctivae and EOM are normal. Pupils are equal, round, and reactive to light.  Neck: Normal range of motion. Neck supple. No JVD present. No thyromegaly present.  Cardiovascular: Normal rate, regular rhythm, normal heart sounds and intact distal pulses.  Exam reveals no gallop and no friction rub.   No murmur heard. Pulmonary/Chest: Effort normal and breath sounds normal. She has no wheezes. She has no rales.  Abdominal: Soft. Bowel sounds are normal. She exhibits no distension and no mass. There is no tenderness.  There is no rebound and no guarding.  Genitourinary:  Vagina normal. No breast swelling, tenderness, discharge or bleeding. There is no rash, tenderness, lesion or injury on the right labia. There is no rash, tenderness, lesion or injury on the left labia. Right adnexum displays no mass, no tenderness and no fullness. Left adnexum displays no mass, no tenderness and no fullness.  Lymphadenopathy:    She has no cervical adenopathy.  Neurological: She is alert and oriented to person, place, and time. She has normal reflexes. No cranial nerve deficit. She exhibits normal muscle tone. Coordination normal.  Skin: Skin is warm and dry. No rash noted. She is not diaphoretic. No erythema. No pallor.  R posterior shoulder with healing eschar without erythema, drainage, swelling.  Chronic appearing in nature.  Psychiatric: She has a normal mood and affect. Her behavior is normal. Judgment and thought content normal.       Assessment & Plan:  Essential hypertension, benign - Plan: amLODipine (NORVASC) 2.5 MG tablet, Basic metabolic panel, POCT urinalysis dipstick  Routine general medical examination at a health care facility  Breast cancer screening - Plan: MM Digital Screening  Anxiety and depression  Frequent headaches  1.  Annual Wellness Exam:  Anticipatory guidance --- weight loss, exercise.  Pap smear not warranted due to hysterectomy status.  Refer for mammogram.  Colonoscopy UTD.  Immunizations reviewed; to check with insurance regarding Zostavax coverage and TDAP coverage.Undergoing treatment for depression.  No hearing loss.  Moderate fall risk. Independent with ADLs.  FULL CODE. 2.  HTN: moderately controlled; add Amlodipine 2.5mg  one daily. Continue Maxzide.  Obtain u/a. 3.  Anxiety and depression; worsening with current changes in living situation and with family members; counseling provided.  Continue Cymbalta and Xanax. 4.  Post-coital headaches:  Persistent; s/p MRI brain;  [facility]  neurology consultation; will place referral.   Meds ordered this encounter  Medications  . amLODipine (NORVASC) 2.5 MG tablet    Sig: Take 1 tablet (2.5 mg total) by mouth daily.    Dispense:  30 tablet    Refill:  5

## 2013-03-08 ENCOUNTER — Encounter: Payer: Self-pay | Admitting: Family Medicine

## 2013-08-30 ENCOUNTER — Ambulatory Visit: Payer: Medicare HMO | Admitting: Family Medicine

## 2013-09-26 ENCOUNTER — Ambulatory Visit (INDEPENDENT_AMBULATORY_CARE_PROVIDER_SITE_OTHER): Payer: Medicare HMO | Admitting: Family Medicine

## 2013-09-26 VITALS — BP 138/90 | HR 106 | Temp 99.2°F | Resp 18 | Ht 63.0 in | Wt 244.4 lb

## 2013-09-26 DIAGNOSIS — M7989 Other specified soft tissue disorders: Secondary | ICD-10-CM

## 2013-09-26 DIAGNOSIS — G894 Chronic pain syndrome: Secondary | ICD-10-CM

## 2013-09-26 DIAGNOSIS — F32A Depression, unspecified: Secondary | ICD-10-CM

## 2013-09-26 DIAGNOSIS — I10 Essential (primary) hypertension: Secondary | ICD-10-CM

## 2013-09-26 DIAGNOSIS — F329 Major depressive disorder, single episode, unspecified: Secondary | ICD-10-CM

## 2013-09-26 DIAGNOSIS — Z23 Encounter for immunization: Secondary | ICD-10-CM

## 2013-09-26 DIAGNOSIS — F3289 Other specified depressive episodes: Secondary | ICD-10-CM

## 2013-09-26 LAB — POCT UA - MICROSCOPIC ONLY
Bacteria, U Microscopic: NEGATIVE
CASTS, UR, LPF, POC: NEGATIVE
CRYSTALS, UR, HPF, POC: NEGATIVE
Mucus, UA: NEGATIVE
Yeast, UA: NEGATIVE

## 2013-09-26 LAB — POCT CBC
Granulocyte percent: 64.5 %G (ref 37–80)
HEMATOCRIT: 47.4 % (ref 37.7–47.9)
Hemoglobin: 14.9 g/dL (ref 12.2–16.2)
LYMPH, POC: 0 — AB (ref 0.6–3.4)
MCH: 29.9 pg (ref 27–31.2)
MCHC: 31.4 g/dL — AB (ref 31.8–35.4)
MCV: 95.1 fL (ref 80–97)
MID (cbc): 0.8 (ref 0–0.9)
MPV: 10 fL (ref 0–99.8)
POC Granulocyte: 6.8 (ref 2–6.9)
POC LYMPH %: 28.4 % (ref 10–50)
POC MID %: 7.1 %M (ref 0–12)
Platelet Count, POC: 329 10*3/uL (ref 142–424)
RBC: 4.98 M/uL (ref 4.04–5.48)
RDW, POC: 15.6 %
WBC: 10.6 10*3/uL — AB (ref 4.6–10.2)

## 2013-09-26 LAB — POCT URINALYSIS DIPSTICK
Bilirubin, UA: NEGATIVE
Glucose, UA: NEGATIVE
KETONES UA: NEGATIVE
Leukocytes, UA: NEGATIVE
NITRITE UA: NEGATIVE
PH UA: 6
PROTEIN UA: NEGATIVE
Spec Grav, UA: 1.02
Urobilinogen, UA: 0.2

## 2013-09-26 MED ORDER — ALPRAZOLAM 0.5 MG PO TABS: 0.5000 mg | ORAL_TABLET | Freq: Two times a day (BID) | ORAL | Status: AC | PRN

## 2013-09-26 MED ORDER — OXYCODONE HCL ER 10 MG PO T12A: 10.0000 mg | EXTENDED_RELEASE_TABLET | Freq: Two times a day (BID) | ORAL | Status: AC

## 2013-09-26 MED ORDER — SERTRALINE HCL 100 MG PO TABS: 150.0000 mg | ORAL_TABLET | Freq: Every day | ORAL | Status: AC

## 2013-09-26 MED ORDER — TRIAMTERENE-HCTZ 37.5-25 MG PO TABS: 1.0000 | ORAL_TABLET | Freq: Every day | ORAL | Status: AC

## 2013-09-26 NOTE — Progress Notes (Signed)
This chart was scribed for Wardell Honour, MD by Einar Pheasant, ED Scribe. This patient was seen in room 9 and the patient's care was started at 10:58 AM. Subjective:    Patient ID: Lauren Pittman, female    DOB: March 18, 1951, 63 y.o.   MRN: [id number]  09/26/2013  Foot Swelling and Joint Swelling   HPI HPI Comments: Lauren Pittman is a 63 y.o. female who presents to the [facility] requesting a follow up  for foot swelling and joint swelling that started about 6 months ago.   Pt states that she is doing better than her last time she was seen. She states that since her visit in August she suffered a mental breakdown due to the stress she was going through. She said that life became too overwhelming. Pt states that her insurance also stopped covering her medicine so she had to quit cold [place]. She states after the period of depression she was able to regroup. She was prescribed Zoloft which helped out a lot. Pt states that she doesn't feel a 100% yet, but maybe she will after she completes an upcoming event.   She states that in the last couple of weeks she had some pain medication left over from her pain clinic. Pt states that she is not longer enrolled in that program because she felt as if they were not taking care of her. She states that the dosage prescribed was not enough to control her pain so she decided to take aleve in conjunction. She is not complaining of associated bilateral leg pain and swelling. Pt was concerned that her symptoms were due to her taking the additional antiinflammatory medication. She states that she has stopped taking the aleve.   Pt states that 1 month ago she was diagnosed with diverticulitis.  Pt denies any suicidal ideations, but she states that she didn't care if she woke up or not. She is requesting to up her dosage of Zoloft to 200.   She is requesting a flu vaccine  Review of Systems  Constitutional: Negative for fever, chills,  diaphoresis and fatigue.  HENT: Negative for congestion.   Respiratory: Negative for cough and shortness of breath.   Cardiovascular: Positive for leg swelling. Negative for chest pain and palpitations.  Gastrointestinal: Negative for nausea, vomiting, abdominal pain, diarrhea, constipation, blood in stool, anal bleeding and rectal pain.  Musculoskeletal: Positive for myalgias, joint swelling and arthralgias.  Psychiatric/Behavioral: Positive for dysphoric mood. Negative for suicidal ideas, sleep disturbance and self-injury. The patient is nervous/anxious.     Past Medical History  Diagnosis Date  . Anxiety   . Arthritis   . Depression   . Gastric ulcer   . Calculus of kidney   . Pure hypercholesterolemia   . Allergic rhinitis, cause unspecified   . Tobacco use disorder   . Personal history of colonic polyps   . Osteoarthrosis, unspecified whether generalized or localized, pelvic region and thigh   . Neoplasm of unspecified nature of bone, soft tissue, and skin    Allergies  Allergen Reactions  . Contrast Media [Iodinated Diagnostic Agents] Itching    Near syncope   . Naproxen Itching   Current Outpatient Prescriptions  Medication Sig Dispense Refill  . ALPRAZolam (XANAX) 0.5 MG tablet TAKE 1 TABLET BY MOUTH TWICE A DAY  60 tablet  5  . azaTHIOprine (IMURAN) 50 MG tablet Take 100 mg by mouth daily.      . Cyanocobalamin (VITAMIN B-12 PO) Take by  mouth daily.      Marland Kitchen oxyCODONE (OXYCONTIN) 10 MG 12 hr tablet Take 10 mg by mouth every 12 (twelve) hours.      . Pyridoxine HCl (VITAMIN B-6 PO) Take by mouth daily.      Marland Kitchen triamterene-hydrochlorothiazide (MAXZIDE-25) 37.5-25 MG per tablet Take 1 tablet by mouth daily.  30 tablet  5   No current facility-administered medications for this visit.       Objective:    Triage Vitals:BP 108/80  Pulse 106  Temp(Src) 99.2 F (37.3 C) (Oral)  Resp 18  Ht 5\' 3"  (1.6 m)  Wt 244 lb 6.4 oz (110.859 kg)  BMI 43.30 kg/m2  SpO2  95%  Physical Exam  Nursing note and vitals reviewed. Constitutional: She is oriented to person, place, and time. She appears well-developed and well-nourished. No distress.  HENT:  Head: Normocephalic and atraumatic.  Right Ear: External ear normal.  Left Ear: External ear normal.  Nose: Nose normal.  Mouth/Throat: Oropharynx is clear and moist. No oropharyngeal exudate.  Eyes: Conjunctivae and EOM are normal. Pupils are equal, round, and reactive to light. Right eye exhibits no discharge. Left eye exhibits no discharge.  Neck: Normal range of motion. Neck supple. No thyromegaly present.  Cardiovascular: Normal rate, regular rhythm and normal heart sounds.  Exam reveals no gallop and no friction rub.   No murmur heard. Pulmonary/Chest: Effort normal and breath sounds normal. No respiratory distress. She has no wheezes. She has no rales. She exhibits no tenderness.  Abdominal: Soft. Bowel sounds are normal. She exhibits no distension and no mass. There is no tenderness. There is no rebound.  Musculoskeletal: She exhibits no edema and no tenderness.  Trace pitting edema to bilateral lower extremities.   Lymphadenopathy:    She has no cervical adenopathy.  Neurological: She is alert and oriented to person, place, and time. No cranial nerve deficit.  Skin: Skin is warm and dry. No rash noted. No erythema. No pallor.  Psychiatric: She has a normal mood and affect. Her behavior is normal. Thought content normal.   Results for orders placed in visit on 09/26/13  POCT CBC      Result Value Ref Range   WBC 10.6 (*) 4.6 - 10.2 K/uL   Lymph, poc 0.0 (*) 0.6 - 3.4   POC LYMPH PERCENT 28.4  10 - 50 %L   MID (cbc) 0.8  0 - 0.9   POC MID % 7.1  0 - 12 %M   POC Granulocyte 6.8  2 - 6.9   Granulocyte percent 64.5  37 - 80 %G   RBC 4.98  4.04 - 5.48 M/uL   Hemoglobin 14.9  12.2 - 16.2 g/dL   HCT, POC 47.4  37.7 - 47.9 %   MCV 95.1  80 - 97 fL   MCH, POC 29.9  27 - 31.2 pg   MCHC 31.4 (*) 31.8  - 35.4 g/dL   RDW, POC 15.6     Platelet Count, POC 329  142 - 424 K/uL   MPV 10.0  0 - 99.8 fL   BP Readings from Last 3 Encounters:  09/26/13 108/80  03/01/13 144/98  01/18/13 150/102   Blood pressure was rechecked during Physical exam: 138/90  EKG: NSR    Assessment & Plan:   1. Leg swelling   2. Essential hypertension, benign   3. Depression   4. Chronic pain syndrome   5. Need for prophylactic vaccination and inoculation against influenza  1.  Leg swelling:  Worsening; likely secondary to recent NSAID use; no associated chest pain, DOE, or orthopnea.  If persists or worsens, recommend referral to cardiology; obtain labs including u/a, CMET, CBC.  Rx for Maxzide provided.  EKG obtained and stable.  2.   HTN: controlled; obtain labs; rx for Maxzide provided. 3.  Depression: worsening since visit six months ago; agree with increasing Zoloft to 200mg  daily.  Refill of Xanax also provided.  Counseling provided during visit.  4.  Chronic pain syndrome: worsening due to weaning off of Oxycontin due to change in insurance.  Rx for Oxycontin 10mg  bid #60 provided as a one time rx.     Meds ordered this encounter  Medications  . DISCONTD: sertraline (ZOLOFT) 100 MG tablet    Sig: Take 100 mg by mouth daily.  . butalbital-aspirin-caffeine (FIORINAL) 50-325-40 MG per capsule    Sig: Take 1 capsule by mouth every 4 (four) hours as needed for headache.  . predniSONE (DELTASONE) 5 MG tablet    Sig: Take 5 mg by mouth daily with breakfast.  . ALPRAZolam (XANAX) 0.5 MG tablet    Sig: Take 1 tablet (0.5 mg total) by mouth 2 (two) times daily as needed for anxiety.    Dispense:  60 tablet    Refill:  5  . sertraline (ZOLOFT) 100 MG tablet    Sig: Take 1.5 tablets (150 mg total) by mouth daily.    Dispense:  45 tablet    Refill:  5  . triamterene-hydrochlorothiazide (MAXZIDE-25) 37.5-25 MG per tablet    Sig: Take 1 tablet by mouth daily.    Dispense:  30 tablet    Refill:  5  .  OxyCODONE (OXYCONTIN) 10 mg T12A 12 hr tablet    Sig: Take 1 tablet (10 mg total) by mouth every 12 (twelve) hours.    Dispense:  60 tablet    Refill:  0    Return in about 6 months (around 03/29/2014) for complete physical examiniation.    I personally performed the services described in this documentation, which was scribed in my presence.  The recorded information has been reviewed and is accurate.  Reginia Forts, M.D.  [facility] [address] [place], [place]  [address] [phone] phone [phone] fax

## 2013-09-27 LAB — COMPREHENSIVE METABOLIC PANEL
ALT: 14 U/L (ref 0–35)
AST: 20 U/L (ref 0–37)
Albumin: 4.2 g/dL (ref 3.5–5.2)
Alkaline Phosphatase: 119 U/L — ABNORMAL HIGH (ref 39–117)
BILIRUBIN TOTAL: 0.5 mg/dL (ref 0.2–1.2)
BUN: 28 mg/dL — ABNORMAL HIGH (ref 6–23)
CO2: 26 meq/L (ref 19–32)
CREATININE: 0.96 mg/dL (ref 0.50–1.10)
Calcium: 9.5 mg/dL (ref 8.4–10.5)
Chloride: 102 mEq/L (ref 96–112)
Glucose, Bld: 92 mg/dL (ref 70–99)
Potassium: 4.5 mEq/L (ref 3.5–5.3)
Sodium: 137 mEq/L (ref 135–145)
Total Protein: 7.3 g/dL (ref 6.0–8.3)

## 2013-10-18 ENCOUNTER — Telehealth: Payer: Self-pay

## 2013-10-18 NOTE — Telephone Encounter (Signed)
PT WANTED TO LEAVE A PERSONAL MESSAGE FOR DR Tamala Julian TO GIVE HER A CALL BACK. DIDN'T WANT TO GO INTO MYCHART, BUT WANT A CALL BACK AT [phone] STATED IT WASN'T ABOUT HER DIAGNOSIS NOR MEDICATION BUT WAS PERSONAL ONLY AND REFUSED TO GO INTO DETAILS WITH ME

## 2013-10-20 NOTE — Telephone Encounter (Signed)
Spoke with patient---feels that declining overall and needs a physician locally; plans to establish with physician in [place].  I agree and support her decision.  Reassurance provided to patient.

## 2014-04-02 ENCOUNTER — Encounter: Payer: Medicare HMO | Admitting: Family Medicine
